# Patient Record
Sex: Female | Born: 2005 | Race: White | Hispanic: No | Marital: Single | Smoking: Never smoker
Health system: Southern US, Community
[De-identification: ages and names within clinical notes are randomized; demographics above are authoritative.]

## PROBLEM LIST (undated history)

## (undated) DIAGNOSIS — J302 Other seasonal allergic rhinitis: Secondary | ICD-10-CM

## (undated) HISTORY — PX: TONSILLECTOMY AND ADENOIDECTOMY: SUR1326

---
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
## 2023-11-10 ENCOUNTER — Emergency Department (HOSPITAL_BASED_OUTPATIENT_CLINIC_OR_DEPARTMENT_OTHER): Admission: EM | Admit: 2023-11-10 | Discharge: 2023-11-10 | Disposition: A | Discharge status: Home / Self Care

## 2023-11-10 ENCOUNTER — Other Ambulatory Visit: Payer: Self-pay

## 2023-11-10 ENCOUNTER — Encounter (HOSPITAL_BASED_OUTPATIENT_CLINIC_OR_DEPARTMENT_OTHER): Payer: Self-pay

## 2023-11-10 DIAGNOSIS — R1031 Right lower quadrant pain: Secondary | ICD-10-CM | POA: Diagnosis present

## 2023-11-10 DIAGNOSIS — R110 Nausea: Secondary | ICD-10-CM | POA: Diagnosis not present

## 2023-11-10 DIAGNOSIS — R197 Diarrhea, unspecified: Secondary | ICD-10-CM | POA: Insufficient documentation

## 2023-11-10 LAB — URINALYSIS, ROUTINE W REFLEX MICROSCOPIC
Bilirubin Urine: NEGATIVE
Glucose, UA: NEGATIVE mg/dL
Hgb urine dipstick: NEGATIVE
Ketones, ur: NEGATIVE mg/dL
Leukocytes,Ua: NEGATIVE
Nitrite: NEGATIVE
Protein, ur: NEGATIVE mg/dL
Specific Gravity, Urine: 1.019 (ref 1.005–1.030)
pH: 6 (ref 5.0–8.0)

## 2023-11-10 LAB — COMPREHENSIVE METABOLIC PANEL WITH GFR
ALT: 14 U/L (ref 0–44)
AST: 17 U/L (ref 15–41)
Albumin: 4 g/dL (ref 3.5–5.0)
Alkaline Phosphatase: 48 U/L (ref 38–126)
Anion gap: 13 (ref 5–15)
BUN: 16 mg/dL (ref 6–20)
CO2: 22 mmol/L (ref 22–32)
Calcium: 9.5 mg/dL (ref 8.9–10.3)
Chloride: 104 mmol/L (ref 98–111)
Creatinine, Ser: 0.69 mg/dL (ref 0.44–1.00)
GFR, Estimated: >60 mL/min (ref >60)
Glucose, Bld: 101 mg/dL — ABNORMAL HIGH (ref 70–99)
Potassium: 3.8 mmol/L (ref 3.5–5.1)
Sodium: 138 mmol/L (ref 135–145)
Total Bilirubin: <0.2 mg/dL (ref 0.0–1.2)
Total Protein: 6.2 g/dL — ABNORMAL LOW (ref 6.5–8.1)

## 2023-11-10 LAB — CBC
HCT: 39.3 % (ref 36.0–46.0)
Hemoglobin: 13.9 g/dL (ref 12.0–15.0)
MCH: 31.1 pg (ref 26.0–34.0)
MCHC: 35.4 g/dL (ref 30.0–36.0)
MCV: 87.9 fL (ref 80.0–100.0)
Platelets: 343 10*3/uL (ref 150–400)
RBC: 4.47 MIL/uL (ref 3.87–5.11)
RDW: 11.6 % (ref 11.5–15.5)
WBC: 9.5 10*3/uL (ref 4.0–10.5)
nRBC: 0 % (ref 0.0–0.2)

## 2023-11-10 LAB — LIPASE, BLOOD: Lipase: 28 U/L (ref 11–51)

## 2023-11-10 LAB — PREGNANCY, URINE: Preg Test, Ur: NEGATIVE

## 2023-11-10 MED ORDER — ONDANSETRON HCL 4 MG/2ML IJ SOLN
4.0000 mg | Freq: Four times a day (QID) | INTRAMUSCULAR | Status: DC | PRN
  Administered 2023-11-10: 4 mg via INTRAVENOUS
  Filled 2023-11-10: qty 2

## 2023-11-10 MED ORDER — KETOROLAC TROMETHAMINE 15 MG/ML IJ SOLN
15.0000 mg | Freq: Once | INTRAMUSCULAR | Status: AC
  Administered 2023-11-10: 15 mg via INTRAVENOUS
  Filled 2023-11-10: qty 1

## 2023-11-10 NOTE — ED Provider Notes (Signed)
 [place] [facility] Provider Note   CSN: [id number] Arrival date & time: 11/10/23  1430     History  Chief Complaint  Patient presents with   Abdominal Pain    Christine Gallegos is a 18 y.o. female with no significant past medical history presents with concern for right lower quadrant pain that started this morning and about 9 AM.  States pain has been fairly constant.  Reports some nausea earlier today but this improved when she ate lunch.  Reports no further nausea or vomiting after this.  Denies any hematuria, dysuria, or increased frequency. Reports some non-bloody diarrhea that started this morning.    Abdominal Pain      Home Medications Prior to Admission medications   Medication Sig Start Date End Date Taking? Authorizing Provider  gentamicin  cream (GARAMYCIN ) 0.1 % Apply 1 application topically 2 (two) times daily. 06/09/18   Dot Gazella, DPM  mometasone (NASONEX) 50 MCG/ACT nasal spray Place 1 spray into the nose daily as needed. For allergies.    [provider]  naproxen  (NAPROSYN ) 375 MG tablet Take 1 tablet (375 mg total) by mouth 2 (two) times daily as needed for moderate pain. 06/05/20   Petrucelli, Samantha R, PA-C  ondansetron  (ZOFRAN  ODT) 4 MG disintegrating tablet Take 1 tablet (4 mg total) by mouth every 8 (eight) hours as needed for nausea or vomiting. 06/05/20   Petrucelli, Samantha R, PA-C      Allergies    Patient has no known allergies.    Review of Systems   Review of Systems  Gastrointestinal:  Positive for abdominal pain.    Physical Exam Updated Vital Signs BP 111/62 (BP Location: Right Arm)   Pulse 74   Temp 98.4 F (36.9 C) (Oral)   Resp 18   Ht 5\' 2"  (1.575 m)   Wt 77.1 kg   SpO2 98%   BMI 31.09 kg/m  Physical Exam Vitals and nursing note reviewed.  Constitutional:      General: She is not in acute distress.    Appearance: She is well-developed.     Comments: No vomiting  HENT:      Head: Normocephalic and atraumatic.  Eyes:     Conjunctiva/sclera: Conjunctivae normal.  Cardiovascular:     Rate and Rhythm: Normal rate and regular rhythm.     Heart sounds: No murmur heard. Pulmonary:     Effort: Pulmonary effort is normal. No respiratory distress.     Breath sounds: Normal breath sounds.  Abdominal:     Palpations: Abdomen is soft.     Tenderness: There is no abdominal tenderness.     Comments: Mild tenderness in the RLQ without rebound or guarding   Musculoskeletal:        General: No swelling.     Cervical back: Neck supple.  Skin:    General: Skin is warm and dry.     Capillary Refill: Capillary refill takes less than 2 seconds.  Neurological:     Mental Status: She is alert.  Psychiatric:        Mood and Affect: Mood normal.     ED Results / Procedures / Treatments   Labs (all labs ordered are listed, but only abnormal results are displayed) Labs Reviewed  COMPREHENSIVE METABOLIC PANEL WITH GFR - Abnormal; Notable for the following components:      Result Value   Glucose, Bld 101 (*)    Total Protein 6.2 (*)    All other  components within normal limits  URINALYSIS, ROUTINE W REFLEX MICROSCOPIC - Abnormal; Notable for the following components:   Bacteria, UA RARE (*)    All other components within normal limits  LIPASE, BLOOD  CBC  PREGNANCY, URINE    EKG None  Radiology No results found.  Procedures Procedures    Medications Ordered in ED Medications  ondansetron  (ZOFRAN ) injection 4 mg (4 mg Intravenous Given 11/10/23 1543)  ketorolac (TORADOL) 15 MG/ML injection 15 mg (15 mg Intravenous Given 11/10/23 1543)    ED Course/ Medical Decision Making/ A&P                                 Medical Decision Making Amount and/or Complexity of Data Reviewed Labs: ordered.  Risk Prescription drug management.     Differential diagnosis includes but is not limited to Cholelithiasis, cholangitis, choledocholithiasis, peptic ulcer,  gastritis, gastroenteritis, appendicitis, IBS, IBD, DKA, nephrolithiasis, UTI, pyelonephritis, pancreatitis, diverticulitis, mesenteric ischemia, abdominal aortic aneurysm, small bowel obstruction, volvulus, ovarian torsion and pregnancy related concerns in females of childbearing age    ED Course:  Upon initial evaluation, patient is well appearing, stable vitals. No vomiting. Mildly tender in RLQ without rebound or guarding  Labs Ordered: I Ordered, and personally interpreted labs.  The pertinent results include:   CBC without leukocytosis CMP without any elevation LFTs or creatinine, normal electrolytes Lipase within normal limits Urinalysis without signs of infection Urine pregnancy negative    Medications Given: Zofran  for nausea Toradol for pain  Upon re-evaluation, patient reports symptoms have resolved with medications.  Currently feels at baseline.  I had a shared decision-making conversation with the patient regarding pursuing additional CT imaging of the abdomen.  Given no significant abdominal tenderness to palpation, no leukocytosis, no fevers, no vital sign abnormalities, low concern for acute intra-abdominal pathology at this time.  Given some diarrhea this morning, this could be a gastroenteritis.  Diarrhea nonbloody, no fevers or leukocytosis, low concern for infectious etiology. No concern for SBO given still having BM's. Feel that we could monitor symptoms at home and have her return if she develops any worsening or new symptoms.  Patient and mom at bedside are in agreement with plan.    Impression: RLQ abdominal pain  Disposition:  The patient was discharged home with instructions to take Tylenol and ibuprofen  as needed for pain.  Patient declines any nausea medication for home at this time.  We discussed very strict return precautions.   This chart was dictated using voice recognition software, Dragon. Despite the best efforts of this provider to proofread and  correct errors, errors may still occur which can change documentation meaning.          Final Clinical Impression(s) / ED Diagnoses Final diagnoses:  Right lower quadrant abdominal pain    Rx / DC Orders ED Discharge Orders     None         Rexie Catena, Kirby Peoples 11/10/23 1649    Ninetta Basket, MD 11/12/23 [phone]

## 2023-11-10 NOTE — Discharge Instructions (Addendum)
 Your blood counts were normal today did not show any signs of infection.  Your kidney, liver, and pancreas labs are normal.  Your urine did not show any signs of infection.  Your pregnancy test was negative.  You may use up to 600mg  ibuprofen  every 6 hours as needed for pain.  Do not exceed 2.4g of ibuprofen  per day.  You may take up to 1000mg  of tylenol every 6 hours as needed for pain.  Do not take more then 4g per day.  Please return to the ER if you have any worsening abdominal pain, fevers, persistent vomiting, if you are no longer passing gas or having bowel movements, any other new or concerning symptoms.

## 2023-11-10 NOTE — ED Triage Notes (Signed)
 Patient arrives POV with complaints of right lower abdominal pain that started today. Rates pain a 7/10. Also reports pressure with urination.
